# Patient Record
Sex: Male | Born: 1980 | Hispanic: Yes | Marital: Single | Smoking: Never smoker
Health system: Southern US, Community
[De-identification: ages and names within clinical notes are randomized; demographics above are authoritative.]

## PROBLEM LIST (undated history)

## (undated) DIAGNOSIS — S83519A Sprain of anterior cruciate ligament of unspecified knee, initial encounter: Secondary | ICD-10-CM

## (undated) DIAGNOSIS — S83249A Other tear of medial meniscus, current injury, unspecified knee, initial encounter: Secondary | ICD-10-CM

---

## 2003-01-24 ENCOUNTER — Encounter: Payer: Self-pay | Admitting: Emergency Medicine

## 2003-01-24 ENCOUNTER — Emergency Department (HOSPITAL_COMMUNITY): Admission: EM | Admit: 2003-01-24 | Discharge: 2003-01-24 | Payer: Self-pay | Admitting: Emergency Medicine

## 2010-05-22 ENCOUNTER — Ambulatory Visit (HOSPITAL_COMMUNITY): Admission: RE | Admit: 2010-05-22 | Discharge: 2010-05-22 | Payer: Self-pay | Admitting: Orthopedic Surgery

## 2010-06-12 ENCOUNTER — Ambulatory Visit
Admission: RE | Admit: 2010-06-12 | Discharge: 2010-06-12 | Payer: Self-pay | Source: Home / Self Care | Admitting: Orthopedic Surgery

## 2010-06-12 HISTORY — PX: KNEE ARTHROSCOPY W/ ACL RECONSTRUCTION: SHX1858

## 2010-11-06 LAB — POCT HEMOGLOBIN-HEMACUE: Hemoglobin: 16 g/dL (ref 13.0–17.0)

## 2014-01-23 DIAGNOSIS — S83249A Other tear of medial meniscus, current injury, unspecified knee, initial encounter: Secondary | ICD-10-CM

## 2014-01-23 DIAGNOSIS — S83519A Sprain of anterior cruciate ligament of unspecified knee, initial encounter: Secondary | ICD-10-CM

## 2014-01-23 HISTORY — DX: Other tear of medial meniscus, current injury, unspecified knee, initial encounter: S83.249A

## 2014-01-23 HISTORY — DX: Sprain of anterior cruciate ligament of unspecified knee, initial encounter: S83.519A

## 2014-02-03 ENCOUNTER — Encounter (HOSPITAL_BASED_OUTPATIENT_CLINIC_OR_DEPARTMENT_OTHER): Payer: Self-pay | Admitting: *Deleted

## 2014-02-08 ENCOUNTER — Encounter (HOSPITAL_BASED_OUTPATIENT_CLINIC_OR_DEPARTMENT_OTHER)
Admission: RE | Disposition: A | Discharge status: Home / Self Care | Payer: Self-pay | Source: Ambulatory Visit | Attending: Orthopedic Surgery

## 2014-02-08 ENCOUNTER — Ambulatory Visit (HOSPITAL_BASED_OUTPATIENT_CLINIC_OR_DEPARTMENT_OTHER): Payer: Self-pay | Admitting: Anesthesiology

## 2014-02-08 ENCOUNTER — Encounter (HOSPITAL_BASED_OUTPATIENT_CLINIC_OR_DEPARTMENT_OTHER): Payer: Self-pay | Admitting: Anesthesiology

## 2014-02-08 ENCOUNTER — Ambulatory Visit (HOSPITAL_BASED_OUTPATIENT_CLINIC_OR_DEPARTMENT_OTHER)
Admission: RE | Admit: 2014-02-08 | Discharge: 2014-02-08 | Disposition: A | Discharge status: Home / Self Care | Payer: Self-pay | Source: Ambulatory Visit | Attending: Orthopedic Surgery | Admitting: Orthopedic Surgery

## 2014-02-08 DIAGNOSIS — IMO0002 Reserved for concepts with insufficient information to code with codable children: Secondary | ICD-10-CM | POA: Insufficient documentation

## 2014-02-08 DIAGNOSIS — X500XXA Overexertion from strenuous movement or load, initial encounter: Secondary | ICD-10-CM | POA: Insufficient documentation

## 2014-02-08 DIAGNOSIS — S83509A Sprain of unspecified cruciate ligament of unspecified knee, initial encounter: Secondary | ICD-10-CM | POA: Insufficient documentation

## 2014-02-08 HISTORY — DX: Other tear of medial meniscus, current injury, unspecified knee, initial encounter: S83.249A

## 2014-02-08 HISTORY — DX: Sprain of anterior cruciate ligament of unspecified knee, initial encounter: S83.519A

## 2014-02-08 HISTORY — PX: KNEE ARTHROSCOPY WITH ANTERIOR CRUCIATE LIGAMENT (ACL) REPAIR: SHX5644

## 2014-02-08 SURGERY — KNEE ARTHROSCOPY WITH ANTERIOR CRUCIATE LIGAMENT (ACL) REPAIR
Anesthesia: General | Site: Knee | Laterality: Right

## 2014-02-08 MED ORDER — SODIUM CHLORIDE 0.9 % IR SOLN
Status: DC | PRN
  Administered 2014-02-08: 6000 mL

## 2014-02-08 MED ORDER — FENTANYL CITRATE 0.05 MG/ML IJ SOLN
50.0000 ug | INTRAMUSCULAR | Status: DC | PRN
  Administered 2014-02-08: 100 ug via INTRAVENOUS

## 2014-02-08 MED ORDER — DEXAMETHASONE SODIUM PHOSPHATE 4 MG/ML IJ SOLN
INTRAMUSCULAR | Status: DC | PRN
  Administered 2014-02-08: 10 mg via INTRAVENOUS

## 2014-02-08 MED ORDER — MIDAZOLAM HCL 5 MG/5ML IJ SOLN
INTRAMUSCULAR | Status: DC | PRN
  Administered 2014-02-08: 2 mg via INTRAVENOUS

## 2014-02-08 MED ORDER — KETOROLAC TROMETHAMINE 30 MG/ML IJ SOLN: 15.0000 mg | Freq: Once | INTRAMUSCULAR | Status: DC | PRN

## 2014-02-08 MED ORDER — FENTANYL CITRATE 0.05 MG/ML IJ SOLN
INTRAMUSCULAR | Status: AC
  Filled 2014-02-08: qty 2

## 2014-02-08 MED ORDER — MIDAZOLAM HCL 2 MG/2ML IJ SOLN
INTRAMUSCULAR | Status: AC
  Filled 2014-02-08: qty 2

## 2014-02-08 MED ORDER — OXYCODONE HCL 5 MG/5ML PO SOLN: 5.0000 mg | Freq: Once | ORAL | Status: DC | PRN

## 2014-02-08 MED ORDER — MIDAZOLAM HCL 2 MG/ML PO SYRP: 12.0000 mg | ORAL_SOLUTION | Freq: Once | ORAL | Status: DC | PRN

## 2014-02-08 MED ORDER — LIDOCAINE HCL (CARDIAC) 20 MG/ML IV SOLN
INTRAVENOUS | Status: DC | PRN
  Administered 2014-02-08: 100 mg via INTRAVENOUS

## 2014-02-08 MED ORDER — LACTATED RINGERS IV SOLN
INTRAVENOUS | Status: DC
  Administered 2014-02-08: 13:00:00 via INTRAVENOUS

## 2014-02-08 MED ORDER — FENTANYL CITRATE 0.05 MG/ML IJ SOLN
INTRAMUSCULAR | Status: AC
  Filled 2014-02-08: qty 6

## 2014-02-08 MED ORDER — ONDANSETRON HCL 4 MG/2ML IJ SOLN: 4.0000 mg | Freq: Once | INTRAMUSCULAR | Status: DC | PRN

## 2014-02-08 MED ORDER — OXYCODONE HCL 5 MG PO TABS: 5.0000 mg | ORAL_TABLET | Freq: Once | ORAL | Status: DC | PRN

## 2014-02-08 MED ORDER — PROPOFOL 10 MG/ML IV BOLUS
INTRAVENOUS | Status: DC | PRN
  Administered 2014-02-08: 200 mg via INTRAVENOUS

## 2014-02-08 MED ORDER — LACTATED RINGERS IV SOLN
INTRAVENOUS | Status: DC | PRN
  Administered 2014-02-08 (×2): via INTRAVENOUS

## 2014-02-08 MED ORDER — HYDROMORPHONE HCL PF 1 MG/ML IJ SOLN
0.2500 mg | INTRAMUSCULAR | Status: DC | PRN
  Administered 2014-02-08: 0.5 mg via INTRAVENOUS

## 2014-02-08 MED ORDER — OXYCODONE-ACETAMINOPHEN 5-325 MG PO TABS: 1.0000 | ORAL_TABLET | Freq: Four times a day (QID) | ORAL | Status: DC | PRN

## 2014-02-08 MED ORDER — MIDAZOLAM HCL 2 MG/2ML IJ SOLN
1.0000 mg | INTRAMUSCULAR | Status: DC | PRN
  Administered 2014-02-08: 2 mg via INTRAVENOUS

## 2014-02-08 MED ORDER — CHLORHEXIDINE GLUCONATE 4 % EX LIQD: 60.0000 mL | Freq: Once | CUTANEOUS | Status: DC

## 2014-02-08 MED ORDER — CEFAZOLIN SODIUM-DEXTROSE 2-3 GM-% IV SOLR
2.0000 g | INTRAVENOUS | Status: AC
  Administered 2014-02-08: 1 g via INTRAVENOUS
  Administered 2014-02-08: 2 g via INTRAVENOUS

## 2014-02-08 MED ORDER — BUPIVACAINE HCL (PF) 0.5 % IJ SOLN
INTRAMUSCULAR | Status: AC
  Filled 2014-02-08: qty 30

## 2014-02-08 MED ORDER — FENTANYL CITRATE 0.05 MG/ML IJ SOLN
INTRAMUSCULAR | Status: DC | PRN
  Administered 2014-02-08: 10 ug via INTRAVENOUS
  Administered 2014-02-08: 15 ug via INTRAVENOUS
  Administered 2014-02-08: 25 ug via INTRAVENOUS
  Administered 2014-02-08 (×2): 50 ug via INTRAVENOUS

## 2014-02-08 MED ORDER — CEFAZOLIN SODIUM-DEXTROSE 2-3 GM-% IV SOLR
INTRAVENOUS | Status: AC
  Filled 2014-02-08: qty 50

## 2014-02-08 MED ORDER — HYDROMORPHONE HCL PF 1 MG/ML IJ SOLN
INTRAMUSCULAR | Status: AC
  Filled 2014-02-08: qty 1

## 2014-02-08 SURGICAL SUPPLY — 81 items
ANCHOR PUSHLOCK PEEK 3.5X19.5 (Anchor) ×3 IMPLANT
BANDAGE ESMARK 6X9 LF (GAUZE/BANDAGES/DRESSINGS) ×1 IMPLANT
BENZOIN TINCTURE PRP APPL 2/3 (GAUZE/BANDAGES/DRESSINGS) ×3 IMPLANT
BLADE 4.2CUDA (BLADE) IMPLANT
BLADE AVERAGE 25MMX9MM (BLADE) ×1
BLADE AVERAGE 25X9 (BLADE) ×2 IMPLANT
BLADE CUDA 5.5 (BLADE) IMPLANT
BLADE CUTTER GATOR 3.5 (BLADE) IMPLANT
BLADE GREAT WHITE 4.2 (BLADE) ×2 IMPLANT
BLADE GREAT WHITE 4.2MM (BLADE) ×1
BLADE OSCIL/SAGITTAL W/10 ST (BLADE) IMPLANT
BLADE OSCIL/SAGITTAL W/10MM ST (BLADE)
BLADE SURG 15 STRL LF DISP TIS (BLADE) ×1 IMPLANT
BLADE SURG 15 STRL SS (BLADE) ×2
BNDG ESMARK 6X9 LF (GAUZE/BANDAGES/DRESSINGS) ×3
BUR OVAL 4.0 (BURR) ×3 IMPLANT
CANISTER SUCT 3000ML (MISCELLANEOUS) IMPLANT
CLOSURE WOUND 1/2 X4 (GAUZE/BANDAGES/DRESSINGS) ×1
COVER TABLE BACK 60X90 (DRAPES) ×3 IMPLANT
DRAPE ARTHROSCOPY W/POUCH 114 (DRAPES) ×3 IMPLANT
DRSG EMULSION OIL 3X3 NADH (GAUZE/BANDAGES/DRESSINGS) ×3 IMPLANT
DURAPREP 26ML APPLICATOR (WOUND CARE) ×3 IMPLANT
ELECT MENISCUS 165MM 90D (ELECTRODE) IMPLANT
ELECT REM PT RETURN 9FT ADLT (ELECTROSURGICAL) ×3
ELECTRODE REM PT RTRN 9FT ADLT (ELECTROSURGICAL) ×1 IMPLANT
GAUZE SPONGE 4X4 12PLY STRL (GAUZE/BANDAGES/DRESSINGS) ×3 IMPLANT
GLOVE BIO SURGEON STRL SZ 6.5 (GLOVE) ×4 IMPLANT
GLOVE BIO SURGEONS STRL SZ 6.5 (GLOVE) ×2
GLOVE BIOGEL M STRL SZ7.5 (GLOVE) ×3 IMPLANT
GLOVE BIOGEL PI IND STRL 7.0 (GLOVE) ×2 IMPLANT
GLOVE BIOGEL PI IND STRL 8 (GLOVE) ×3 IMPLANT
GLOVE BIOGEL PI INDICATOR 7.0 (GLOVE) ×4
GLOVE BIOGEL PI INDICATOR 8 (GLOVE) ×6
GLOVE ECLIPSE 7.5 STRL STRAW (GLOVE) ×6 IMPLANT
GOWN STRL REUS W/ TWL LRG LVL3 (GOWN DISPOSABLE) ×3 IMPLANT
GOWN STRL REUS W/ TWL XL LVL3 (GOWN DISPOSABLE) ×2 IMPLANT
GOWN STRL REUS W/TWL LRG LVL3 (GOWN DISPOSABLE) ×6
GOWN STRL REUS W/TWL XL LVL3 (GOWN DISPOSABLE) ×7 IMPLANT
GRAFT ACHILLES TENDON (Bone Implant) ×3 IMPLANT
HOLDER KNEE FOAM BLUE (MISCELLANEOUS) ×3 IMPLANT
IMMOBILIZER KNEE 22 UNIV (SOFTGOODS) IMPLANT
IMMOBILIZER KNEE 24 THIGH 36 (MISCELLANEOUS) IMPLANT
IMMOBILIZER KNEE 24 UNIV (MISCELLANEOUS)
IV NS IRRIG 3000ML ARTHROMATIC (IV SOLUTION) ×6 IMPLANT
KIT TRANSTIBIAL (DISPOSABLE) ×3 IMPLANT
KNEE WRAP E Z 3 GEL PACK (MISCELLANEOUS) ×3 IMPLANT
KNIFE GRAFT ACL 10MM 5952 (MISCELLANEOUS) IMPLANT
KNIFE GRAFT ACL 11MM (MISCELLANEOUS) IMPLANT
MANIFOLD NEPTUNE II (INSTRUMENTS) ×3 IMPLANT
NDL SAFETY ECLIPSE 18X1.5 (NEEDLE) IMPLANT
NEEDLE HYPO 18GX1.5 SHARP (NEEDLE)
PACK ARTHROSCOPY DSU (CUSTOM PROCEDURE TRAY) ×3 IMPLANT
PACK BASIN DAY SURGERY FS (CUSTOM PROCEDURE TRAY) ×3 IMPLANT
PAD CAST 4YDX4 CTTN HI CHSV (CAST SUPPLIES) ×1 IMPLANT
PADDING CAST COTTON 4X4 STRL (CAST SUPPLIES) ×2
PASSER SUT SWANSON 36MM LOOP (INSTRUMENTS) IMPLANT
PATELLA LIGAMENT BISECTED FR (Tissue) ×3 IMPLANT
PENCIL BUTTON HOLSTER BLD 10FT (ELECTRODE) IMPLANT
SCREW BIO DELTA 10MM (Screw) ×3 IMPLANT
SCREW SHEATHED INTERF 9X25MM (Screw) ×3 IMPLANT
SET ARTHROSCOPY TUBING (MISCELLANEOUS) ×2
SET ARTHROSCOPY TUBING LN (MISCELLANEOUS) ×1 IMPLANT
SHEET MEDIUM DRAPE 40X70 STRL (DRAPES) ×3 IMPLANT
SPONGE LAP 4X18 X RAY DECT (DISPOSABLE) ×3 IMPLANT
STRIP CLOSURE SKIN 1/2X4 (GAUZE/BANDAGES/DRESSINGS) ×2 IMPLANT
SUCTION FRAZIER TIP 10 FR DISP (SUCTIONS) ×3 IMPLANT
SUT ETHILON 4 0 PS 2 18 (SUTURE) IMPLANT
SUT MNCRL AB 3-0 PS2 18 (SUTURE) ×3 IMPLANT
SUT PDS AB 1 CT  36 (SUTURE) ×6
SUT PDS AB 1 CT 36 (SUTURE) ×3 IMPLANT
SUT STEEL 5 (SUTURE) ×3 IMPLANT
SUT TICRON 1 T 12 (SUTURE) IMPLANT
SUT VIC AB 0 CT1 27 (SUTURE)
SUT VIC AB 0 CT1 27XBRD ANBCTR (SUTURE) IMPLANT
SUT VIC AB 0 SH 27 (SUTURE) ×3 IMPLANT
SUT VIC AB 2-0 SH 27 (SUTURE)
SUT VIC AB 2-0 SH 27XBRD (SUTURE) IMPLANT
SYR 5ML LL (SYRINGE) ×3 IMPLANT
TOWEL OR 17X24 6PK STRL BLUE (TOWEL DISPOSABLE) ×9 IMPLANT
TOWEL OR NON WOVEN STRL DISP B (DISPOSABLE) ×3 IMPLANT
WATER STERILE IRR 1000ML POUR (IV SOLUTION) ×3 IMPLANT

## 2014-02-08 NOTE — Anesthesia Preprocedure Evaluation (Signed)
Anesthesia Evaluation  Patient identified by MRN, date of birth, ID band Patient awake    Reviewed: Allergy & Precautions, H&P , NPO status , Patient's Chart, lab work & pertinent test results  Airway Mallampati: II TM Distance: >3 FB Neck ROM: Full    Dental  (+) Teeth Intact, Dental Advisory Given   Pulmonary  breath sounds clear to auscultation        Cardiovascular Rhythm:Regular Rate:Normal     Neuro/Psych    GI/Hepatic   Endo/Other    Renal/GU      Musculoskeletal   Abdominal   Peds  Hematology   Anesthesia Other Findings   Reproductive/Obstetrics                           Anesthesia Physical Anesthesia Plan  ASA: I  Anesthesia Plan: General   Post-op Pain Management:    Induction: Intravenous  Airway Management Planned: LMA  Additional Equipment:   Intra-op Plan:   Post-operative Plan:   Informed Consent: I have reviewed the patients History and Physical, chart, labs and discussed the procedure including the risks, benefits and alternatives for the proposed anesthesia with the patient or authorized representative who has indicated his/her understanding and acceptance.   Dental advisory given  Plan Discussed with: CRNA and Anesthesiologist  Anesthesia Plan Comments:         Anesthesia Quick Evaluation

## 2014-02-08 NOTE — Discharge Instructions (Signed)
POST-OP ACL RECONSTRUCTION  1. Come to office tomorrow at 2 PM for your  first postop visit  2. Leave the steri-strips in place over your incisions when performing dressing changes and showering. Remove your dressings tomorrow to perform first dressings change. Then change dressing daily or as needed. You may shower and get incision wet on day 5 from surgery. Do not submerge incision in tub or under water.  3. Wear your knee immobilizer or hinged knee brace to sleep and at all times while up. You may put full weight on operative leg with brace on. Crutches are for comfort. Remove brace to perform attached exercises.  4. It is strongly recommended to ice and elevate your leg on a regular basis and at a minimum of 4 times a day for 30 minute sessions. You should ice after your exercise sessions and more if you are having a lot of pain or increase in swelling.  5. The thigh high Ted hose (white stockings) help to decrease swelling and prevent blood clots. It is recommended that you wear them on both legs for the first 3 days. Then you should wear on the operative extremity when upright but can remove to sleep.  6. If you had a block pre-operatively to provide post-op pain relief you may want to go ahead and begin utilizing your pain meds as your arm begins to wake up. Blocks can sometimes last up to 16-18 hours. If you are still pain-free prior to going to bed you may want to strongly consider taking a pain medication to avoid being awakened in the night with the onset of pain. A muscle relaxant is also provided for you should you experience muscle spasms. It is recommended that if you are experiencing pain that your pain medication alone is not controlling, add the muscle relaxant along with the pain medication which can give additional pain relief. The first one to two days is generally the most severe of your pain and then should gradually decrease. As your pain lessens it is recommended that you  decrease your use of the pain medications to an "as needed basis" only and to always comply with the recommended dosages of the pain medications.  7. Pain medications can produce constipation along with their use. If you experience this, the use of an over the counter stool softener or laxative daily is recommended.   8. For additional questions or concerns, please do not hesitate to call the office. If after hours there is an answering service to forward your concerns to the physician on call.   POST-OP EXERCISES  Repeat each exercise 10 times per set. Do 3 sets per session. Do 3 sessions per day.  Ankle/Foot Range of Motion  With leg relaxed, gently flex and extend ankle. Move through full range of motion.     Knee Extension Mobilization: Towel Prop  With a small towel rolled under your ankle, relax your leg to feel a comfortable stretch along the backside of your knee/leg. Hold for 3-5 minutes.     Hip/Knee Strengthening: Quadriceps Set  Tighten your quadriceps (top of thigh) by pulling your patella (kneecap) toward your hip. Keep your buttocks relaxed. Hold for 5-10 seconds.     Hip/Knee Strengthening: Straight Leg Raise  With your brace on, tighten your quadriceps and lift your leg 12-18 inches from surface.     Hip/Knee Stretching: Calf - Towel  Sit with knee straight and towel looped around your foot. Gently pull on towel  until stretch is felt in calf. Hold for 20-30 seconds.     Hip/Knee: Knee Flexion  With a towel around your heel, gently pull knee up with towel until stretch is felt. Hold 20-30 seconds.          Post Anesthesia Home Care Instructions  Activity: Get plenty of rest for the remainder of the day. A responsible adult should stay with you for 24 hours following the procedure.  For the next 24 hours, DO NOT: -Drive a car -Advertising copywriterperate machinery -Drink alcoholic beverages -Take any medication unless instructed by your physician -Make  any legal decisions or sign important papers.  Meals: Start with liquid foods such as gelatin or soup. Progress to regular foods as tolerated. Avoid greasy, spicy, heavy foods. If nausea and/or vomiting occur, drink only clear liquids until the nausea and/or vomiting subsides. Call your physician if vomiting continues.  Special Instructions/Symptoms: Your throat may feel dry or sore from the anesthesia or the breathing tube placed in your throat during surgery. If this causes discomfort, gargle with warm salt water. The discomfort should disappear within 24 hours.  Regional Anesthesia Blocks  1. Numbness or the inability to move the "blocked" extremity may last from 3-48 hours after placement. The length of time depends on the medication injected and your individual response to the medication. If the numbness is not going away after 48 hours, call your surgeon.  2. The extremity that is blocked will need to be protected until the numbness is gone and the  Strength has returned. Because you cannot feel it, you will need to take extra care to avoid injury. Because it may be weak, you may have difficulty moving it or using it. You may not know what position it is in without looking at it while the block is in effect.  3. For blocks in the legs and feet, returning to weight bearing and walking needs to be done carefully. You will need to wait until the numbness is entirely gone and the strength has returned. You should be able to move your leg and foot normally before you try and bear weight or walk. You will need someone to be with you when you first try to ensure you do not fall and possibly risk injury.  4. Bruising and tenderness at the needle site are common side effects and will resolve in a few days.  5. Persistent numbness or new problems with movement should be communicated to the surgeon or the [facility] [phone])/ [facility] [phone]).

## 2014-02-08 NOTE — Transfer of Care (Signed)
Immediate Anesthesia Transfer of Care Note  Patient: Joe Booth  Procedure(s) Performed: Procedure(s): KNEE ARTHROSCOPY WITH ANTERIOR CRUCIATE LIGAMENT (ACL) REPAIR and partial medial menisectomy (Right)  Patient Location: PACU  Anesthesia Type:GA combined with regional for post-op pain  Level of Consciousness: awake, alert , oriented and patient cooperative  Airway & Oxygen Therapy: Patient Spontanous Breathing and Patient connected to face mask oxygen  Post-op Assessment: Report given to PACU RN and Post -op Vital signs reviewed and stable  Post vital signs: Reviewed and stable  Complications: No apparent anesthesia complications

## 2014-02-08 NOTE — Anesthesia Postprocedure Evaluation (Signed)
  Anesthesia Post-op Note  Patient: Joe Booth  Procedure(s) Performed: Procedure(s): KNEE ARTHROSCOPY WITH ANTERIOR CRUCIATE LIGAMENT (ACL) REPAIR and partial medial menisectomy (Right)  Patient Location: PACU  Anesthesia Type:GA combined with regional for post-op pain  Level of Consciousness: awake, alert  and oriented  Airway and Oxygen Therapy: Patient Spontanous Breathing  Post-op Pain: mild  Post-op Assessment: Post-op Vital signs reviewed  Post-op Vital Signs: Reviewed  Last Vitals:  Filed Vitals:   02/08/14 1745  BP:   Pulse: 105  Temp:   Resp: 15    Complications: No apparent anesthesia complications

## 2014-02-08 NOTE — Brief Op Note (Signed)
02/08/2014  5:20 PM  PATIENT:  Joe Booth  33 y.o. male  PRE-OPERATIVE DIAGNOSIS:  acl and medial meniscus tear  POST-OPERATIVE DIAGNOSIS:  acl and medial meniscus tear  PROCEDURE:  Procedure(s): KNEE ARTHROSCOPY WITH ANTERIOR CRUCIATE LIGAMENT (ACL) REPAIR and partial medial menisectomy (Right)  SURGEON:  Surgeon(s) and Role:    * Harvie JuniorJohn L Labella Zahradnik, MD - Primary  PHYSICIAN ASSISTANT:   ASSISTANTS: bethune   ANESTHESIA:   general  EBL:  Total I/O In: 1400 [I.V.:1400] Out: -   BLOOD ADMINISTERED:none  DRAINS: none   LOCAL MEDICATIONS USED:  NONE  SPECIMEN:  No Specimen  DISPOSITION OF SPECIMEN:  N/A  COUNTS:  YES  TOURNIQUET:   Total Tourniquet Time Documented: Thigh (Right) - 72 minutes Thigh (Right) - 44 minutes Total: Thigh (Right) - 116 minutes   DICTATION: .Other Dictation: Dictation Number [id number]  PLAN OF CARE: Discharge to home after PACU  PATIENT DISPOSITION:  PACU - hemodynamically stable.   Delay start of Pharmacological VTE agent (>24hrs) due to surgical blood loss or risk of bleeding: no

## 2014-02-08 NOTE — H&P (Signed)
PREOPERATIVE H&P  Chief Complaint: r knee instabilityand pain  HPI: Joe Booth is a 33 y.o. male who presents for evaluation of r knee instability and pain. It has been present for 6 months and has been worsening. He has failed conservative measures. Pain is rated as moderate.  Past Medical History  Diagnosis Date  . ACL tear 01/2014    right  . Medial meniscus tear 01/2014    right knee   Past Surgical History  Procedure Laterality Date  . Knee arthroscopy w/ acl reconstruction Right 06/12/2010   History   Social History  . Marital Status: Single    Spouse Name: N/A    Number of Children: N/A  . Years of Education: N/A   Social History Main Topics  . Smoking status: Never Smoker   . Smokeless tobacco: Never Used  . Alcohol Use: Yes     Comment: occasionally  . Drug Use: No  . Sexual Activity: None   Other Topics Concern  . None   Social History Narrative  . None   History reviewed. No pertinent family history. No Known Allergies Prior to Admission medications   Medication Sig Start Date End Date Taking? Authorizing Provider  Multiple Vitamin (MULTIVITAMIN) tablet Take 1 tablet by mouth daily.   Yes Historical Provider, MD     Positive ROS: none  All other systems have been reviewed and were otherwise negative with the exception of those mentioned in the HPI and as above.  Physical Exam: Filed Vitals:   02/08/14 1244  BP: 123/82  Pulse: 61  Temp: 98.2 F (36.8 C)  Resp: 16    General: Alert, no acute distress Cardiovascular: No pedal edema Respiratory: No cyanosis, no use of accessory musculature GI: No organomegaly, abdomen is soft and non-tender Skin: No lesions in the area of chief complaint Neurologic: Sensation intact distally Psychiatric: Patient is competent for consent with normal mood and affect Lymphatic: No axillary or cervical lymphadenopathy  MUSCULOSKELETAL: r knee: painful rom.  +mcmurray// +lochman and pivot  shifty  Assessment/Plan: acl and medial meniscus tear Plan for Procedure(s): KNEE ARTHROSCOPY WITH ANTERIOR CRUCIATE LIGAMENT (ACL) REPAIR and meniscal resection  The risks benefits and alternatives were discussed with the patient including but not limited to the risks of nonoperative treatment, versus surgical intervention including infection, bleeding, nerve injury, malunion, nonunion, hardware prominence, hardware failure, need for hardware removal, blood clots, cardiopulmonary complications, morbidity, mortality, among others, and they were willing to proceed.  Predicted outcome is good, although there will be at least a six to nine month expected recovery.  Harvie JuniorGRAVES,Nautica Hotz L, MD 02/08/2014 12:55 PM

## 2014-02-08 NOTE — Progress Notes (Signed)
Assisted Dr. Joslin with right, ultrasound guided, femoral nerve  block. Side rails up, monitors on throughout procedure. See vital signs in flow sheet. Tolerated Procedure well. 

## 2014-02-08 NOTE — Anesthesia Procedure Notes (Addendum)
Anesthesia Regional Block:  Adductor canal block  Pre-Anesthetic Checklist: ,, timeout performed, Correct Patient, Correct Site, Correct Laterality, Correct Procedure, Correct Position, site marked, Risks and benefits discussed,  Surgical consent,  Pre-op evaluation,  At surgeon's request and post-op pain management  Laterality: Right  Prep: chloraprep       Needles:  Injection technique: Single-shot  Needle Type: Echogenic Stimulator Needle     Needle Length: 9cm 9 cm Needle Gauge: 22 and 22 G    Additional Needles:  Procedures: ultrasound guided (picture in chart) Adductor canal block Narrative:  Start time: 02/08/2014 1:20 PM End time: 02/08/2014 1:25 PM Injection made incrementally with aspirations every 5 mL.  Performed by: Personally   Additional Notes: 30 cc 0.5% marcaine 1:200 Epi  Kipp Broodavid Joslin   Procedure Name: LMA Insertion Date/Time: 02/08/2014 1:53 PM Performed by: Genevieve NorlanderLINKA, MARGARET L Pre-anesthesia Checklist: Patient identified, Emergency Drugs available, Suction available, Patient being monitored and Timeout performed Patient Re-evaluated:Patient Re-evaluated prior to inductionOxygen Delivery Method: Circle System Utilized Preoxygenation: Pre-oxygenation with 100% oxygen Intubation Type: IV induction Ventilation: Mask ventilation without difficulty LMA: LMA inserted LMA Size: 5.0 Number of attempts: 1 Airway Equipment and Method: bite block Placement Confirmation: positive ETCO2 and breath sounds checked- equal and bilateral Tube secured with: Tape Dental Injury: Teeth and Oropharynx as per pre-operative assessment

## 2014-02-09 ENCOUNTER — Encounter (HOSPITAL_BASED_OUTPATIENT_CLINIC_OR_DEPARTMENT_OTHER): Payer: Self-pay | Admitting: Orthopedic Surgery

## 2014-02-09 NOTE — Op Note (Signed)
NAMEmmit Alexanders:  Santiesteban, Breckan                ACCOUNT NO.:  [id number]  MEDICAL RECORD NO.:  [id number]  LOCATION:                                 FACILITY:  PHYSICIAN:  Harvie JuniorJohn L. Graves, M.D.   DATE OF BIRTH:  12/21/1980  DATE OF PROCEDURE:  02/08/2014 DATE OF DISCHARGE:  02/08/2014                              OPERATIVE REPORT   PREOPERATIVE DIAGNOSES: 1. Anterior cruciate ligament re-tear. 2. Medial meniscal bucket-handle tear.  POSTOPERATIVE DIAGNOSES: 1. Anterior cruciate ligament re-tear. 2. Medial meniscal bucket-handle tear.  PROCEDURE: 1. Anterior cruciate ligament reconstruction with Achilles tendon     allograft. 2. Partial medial meniscectomy of a bucket-handle medial meniscal     tear.  SURGEON:  Harvie JuniorJohn L. Graves, M.D.  ASSISTANT:  Marshia LyJames Bethune, PA-C  ANESTHESIA:  General.  BRIEF HISTORY:  Mr. Lonna CobbRomero is a 33 year old male with history of having had an ACL reconstruction, did great for many years, but unfortunately had a retwisting injury.  At that time, suffered a medial meniscal tear and a re-tear of his ACL.  MRI confirmed this and after discussion of treatment options, we felt that fixation was the appropriate course of action.  He is brought to the operating room for this procedure.  DESCRIPTION OF PROCEDURE:  The patient was brought to the operating room.  After adequate anesthesia was obtained with general anesthetic, the patient was placed supine on the operating table.  The right leg was then examined under anesthesia and noted to be Lachman positive and Pivot positive.  At this point, routine arthroscopic examination was performed after prep and drape, and showed that the ACL was torn.  There was a large bucket-handle medial meniscal tear with complex fractured features of the meniscus and this was resected at the anterior and posterior halves and then debrided.  Attention turned to the notch, where the old ACL was resected and the lateral side was examined,  noted to be normal, the patellofemoral joint  normal.  After resecting of the old guide, I did feel like we might try to lower the tunnel down some and so at this point, we did a notchplasty.  We took out the old screw and went ahead and made our incision, and used a 60-degree guide and we were able to pass by the other metal screw easily and at this point, we felt that we would try to use this lower screw, put a guide wire in, and then reamed up, got a nice hole and we were happy with everything.  We pushed past the patellar tendon graft and felt that we would use a larger screw just to try to get fixation if it happened to fill the old hole.  We thought that was good.  Unfortunately in trying to pass the screw, it actually moved the graft into the old hole and the screw diverged into the new hole.  At that point, I had concerns that I could not see the screw and I was not sure what it had happened and so I had to take the screw out and the graft down.  At that point, it looked like the screw had sheared, probably 60-70% of  the interface of the tendon to bone.  At that point, we had to review her options and I felt that the best option at that point, would be to put the Beath needle back in the old position, reamed to 12 and using Achilles tendon allograft, so that we did.  The Achilles tendon allograft was fashioned on the back table by graphologist.  We then advanced a 12 mm reamer through the tibia and through the femur and then you could use the 12 mm bone plug with a 12 mm collagen basically from the whipstitched Achilles tendon graft and put that bone plug up in that hole and now with a 9 x 20 screw was locked in dramatically well.  With tension under the distal graft, we put a 10 x 35 screw in that 12 mm hole, locking the collagen portion of the Achilles in, and then backed this up with a push lock.  Three 5 mm push lock which gave excellent fixation of the Achilles graft at  this point.  We had a rock-solid ACL at this point.  Lachman negative, pivot negative, took a final check of the medial and lateral compartments. These looked good.  No evidence of chondromalacia or problems. At this point, the wound was copiously irrigated, suctioned dry, closed in layers.  We did use the tourniquet in the first portion of the case.  It was up for 76 minutes and then let the tourniquet down for 10 minutes and then reinflated it.  The second tourniquet time was about 44 minutes.  The patient had a sterile compressive dressing applied after his wounds were closed in layers and he was taken to recovery room, he was noted to be in satisfactory condition.  Estimated blood loss for the procedure was minimal.     Harvie JuniorJohn L. Graves, M.D.     Ranae PlumberJLG/MEDQ  D:  02/08/2014  T:  02/08/2014  Job:  [id number]

## 2016-04-08 ENCOUNTER — Encounter: Payer: Self-pay | Admitting: Family Medicine

## 2016-04-08 ENCOUNTER — Ambulatory Visit (INDEPENDENT_AMBULATORY_CARE_PROVIDER_SITE_OTHER): Payer: Self-pay | Admitting: Family Medicine

## 2016-04-08 VITALS — BP 118/76 | HR 74 | Temp 98.3°F | Resp 18 | Ht 67.0 in | Wt 182.0 lb

## 2016-04-08 DIAGNOSIS — N509 Disorder of male genital organs, unspecified: Secondary | ICD-10-CM

## 2016-04-08 DIAGNOSIS — R1031 Right lower quadrant pain: Secondary | ICD-10-CM

## 2016-04-08 DIAGNOSIS — N50819 Testicular pain, unspecified: Secondary | ICD-10-CM | POA: Insufficient documentation

## 2016-04-08 DIAGNOSIS — R1030 Lower abdominal pain, unspecified: Secondary | ICD-10-CM | POA: Insufficient documentation

## 2016-04-08 NOTE — Patient Instructions (Addendum)
   Imaging will contact you to schedule your ultrasound. Ok to take Ibuprofen or use cool compressesfor pain.  Schedule a follow-up visit to discuss fatigue.  Godfrey PickKimberly S. Tiburcio PeaHarris, MSN, FNP-C [facility]   IF you received an x-ray today, you will receive an invoice from [facility]. Please contact [facility] at [phone] with questions or concerns regarding your invoice.   IF you received labwork today, you will receive an invoice from [facility]. Please contact [facility] at [phone] with questions or concerns regarding your invoice.   Our billing staff will not be able to assist you with questions regarding bills from these companies.  You will be contacted with the lab results as soon as they are available. The fastest way to get your results is to activate your My Chart account. Instructions are located on the last page of this paperwork. If you have not heard from us regarding the results in 2 weeks, please contact this office.

## 2016-04-08 NOTE — Progress Notes (Signed)
Patient ID: Joe Booth, male    DOB: 17-Jul-1981, 35 y.o.   MRN: [id number]  PCP: No PCP Per Patient  Chief Complaint  Patient presents with  . Leg Pain    right inner thigh    Subjective:   HPI Presents for evaluation of right upper groin pain x two weeks.  Thought it was originally related to not warming up prior to running and play soccer. Felt a lump or an elongated mass in right groin area. With touch the area hurts worst. Described pain as aching and 3-4/10.  No over the counter medication. Pain increases after sexual intercourse. No testicle swelling or burning with urination.  . Social History   Social History  . Marital status: Single    Spouse name: N/A  . Number of children: N/A  . Years of education: N/A   Occupational History  . Not on file.   Social History Main Topics  . Smoking status: Never Smoker  . Smokeless tobacco: Never Used  . Alcohol use Yes     Comment: occasionally  . Drug use: No  . Sexual activity: Not on file   Other Topics Concern  . Not on file   Social History Narrative  . No narrative on file    . Family History  Problem Relation Age of Onset  . Heart disease Mother     Review of Systems  Constitutional: Positive for fatigue.  Gastrointestinal: Negative.   Genitourinary: Positive for scrotal swelling. Negative for decreased urine volume, difficulty urinating, discharge, dysuria, enuresis, flank pain, hematuria, penile pain, penile swelling and urgency.    There are no active problems to display for this patient.    Prior to Admission medications   Medication Sig Start Date End Date Taking? Authorizing Provider  Multiple Vitamin (MULTIVITAMIN) tablet Take 1 tablet by mouth daily.   Yes Historical Provider, MD  NON FORMULARY    Yes Historical Provider, MD  oxyCODONE-acetaminophen (PERCOCET/ROXICET) 5-325 MG per tablet Take 1-2 tablets by mouth every 6 (six) hours as needed for severe pain. Patient not taking:  Reported on 04/08/2016 02/08/14   Marshia LyJames Bethune, PA-C    No Known Allergies     Objective:  Physical Exam  Constitutional: He is oriented to person, place, and time. He appears well-developed and well-nourished.  HENT:  Head: Normocephalic and atraumatic.  Right Ear: External ear normal.  Left Ear: External ear normal.  Neck: Normal range of motion.  Cardiovascular: Normal rate, regular rhythm and normal heart sounds.   Pulmonary/Chest: Effort normal and breath sounds normal.  Abdominal: Soft. He exhibits no distension and no mass. There is no tenderness. There is no rebound and no guarding.  Genitourinary: Penis normal. No penile tenderness.  Genitourinary Comments: Scrotum appears normal. Absent of mass or lesions. Bilateral testicle palpable. No mass or lesions present.  Denies tenderness with palpation.  Musculoskeletal: Normal range of motion.  Neurological: He is alert and oriented to person, place, and time.  Skin: Skin is warm and dry.   Vitals:   04/08/16 1352  BP: 118/76  Pulse: 74  Resp: 18  Temp: 98.3 F (36.8 C)    Assessment & Plan:  1. Persistent pain in testicle 2. Groin pain, right Physical exam unremarkable. Patient is very concern as this pain has persisted intermittently for two weeks. -US Scrotum   Will follow-up upon receipt of results.  Schedule follow-up to evaluate fatigue.  Godfrey PickKimberly S. Tiburcio PeaHarris, MSN, FNP-C [facility]

## 2016-04-14 ENCOUNTER — Telehealth: Payer: Self-pay

## 2016-04-14 DIAGNOSIS — N509 Disorder of male genital organs, unspecified: Secondary | ICD-10-CM

## 2016-04-14 DIAGNOSIS — N50819 Testicular pain, unspecified: Secondary | ICD-10-CM

## 2016-04-14 NOTE — Telephone Encounter (Signed)
In order to schedule the ultrasound of the scrotum, an additional order is required. Please add an order for US Art/Ven Flow Abd Pelv Doppler.  Thank you.  Sending as high priority, since the order was placed as a STAT.

## 2016-04-14 NOTE — Telephone Encounter (Signed)
Order placed

## 2016-04-18 ENCOUNTER — Ambulatory Visit
Admission: RE | Admit: 2016-04-18 | Discharge: 2016-04-18 | Disposition: A | Discharge status: Home / Self Care | Payer: No Typology Code available for payment source | Source: Ambulatory Visit | Attending: Family Medicine | Admitting: Family Medicine

## 2016-04-18 DIAGNOSIS — N509 Disorder of male genital organs, unspecified: Secondary | ICD-10-CM

## 2016-04-18 DIAGNOSIS — R1031 Right lower quadrant pain: Secondary | ICD-10-CM

## 2016-04-18 DIAGNOSIS — N50819 Testicular pain, unspecified: Secondary | ICD-10-CM

## 2016-04-21 ENCOUNTER — Telehealth: Payer: Self-pay

## 2016-04-21 DIAGNOSIS — N50819 Testicular pain, unspecified: Secondary | ICD-10-CM

## 2016-04-21 NOTE — Telephone Encounter (Signed)
Pt called spoke to him about his ultrasound

## 2016-05-07 ENCOUNTER — Ambulatory Visit (INDEPENDENT_AMBULATORY_CARE_PROVIDER_SITE_OTHER): Payer: Self-pay

## 2016-05-07 ENCOUNTER — Ambulatory Visit (INDEPENDENT_AMBULATORY_CARE_PROVIDER_SITE_OTHER): Payer: Self-pay | Admitting: Urgent Care

## 2016-05-07 VITALS — BP 118/82 | HR 78 | Temp 97.5°F | Resp 17 | Ht 67.0 in | Wt 185.0 lb

## 2016-05-07 DIAGNOSIS — S4991XA Unspecified injury of right shoulder and upper arm, initial encounter: Secondary | ICD-10-CM

## 2016-05-07 DIAGNOSIS — M25511 Pain in right shoulder: Secondary | ICD-10-CM

## 2016-05-07 MED ORDER — NAPROXEN SODIUM 550 MG PO TABS: 550.0000 mg | ORAL_TABLET | Freq: Two times a day (BID) | ORAL | 1 refills | Status: DC

## 2016-05-07 MED ORDER — CYCLOBENZAPRINE HCL 5 MG PO TABS: 5.0000 mg | ORAL_TABLET | Freq: Three times a day (TID) | ORAL | 1 refills | Status: DC | PRN

## 2016-05-07 NOTE — Progress Notes (Signed)
    MRN: [id number] DOB: 09-Aug-1981  Subjective:   Joe Booth is a 35 y.o. male presenting for chief complaint of Shoulder Pain (Right. Soccer injury. )  Reports 3 day history of right should injury. Patient was playing soccer and collided with another player. He has since had worsening right shoulder pain. Pain is constant, achy with intermittent sharp pains, has associated swelling. Has difficulty raising his arm. Has tried Advil and Bengay with minimal relief. Denies hearing popping or tearing noises, neck pain, numbness or tingling, bruising, redness.  Joe Booth has a current medication list which includes the following prescription(s): multivitamin, NON FORMULARY, and oxycodone-acetaminophen. Also has No Known Allergies.  Joe Booth  has a past medical history of ACL tear (01/2014) and Medial meniscus tear (01/2014). Also  has a past surgical history that includes Knee arthroscopy w/ ACL reconstruction (Right, 06/12/2010) and Knee arthroscopy with anterior cruciate ligament (acl) repair (Right, 02/08/2014).  Objective:   Vitals: BP 118/82 (BP Location: Left Arm, Patient Position: Sitting, Cuff Size: Normal)   Pulse 78   Temp 97.5 F (36.4 C) (Oral)   Resp 17   Ht 5\' 7"  (1.702 m)   Wt 185 lb (83.9 kg)   SpO2 98%   BMI 28.98 kg/m   Physical Exam  Constitutional: He is oriented to person, place, and time. He appears well-developed and well-nourished.  Cardiovascular: Normal rate.   Pulmonary/Chest: Effort normal.  Musculoskeletal:       Right shoulder: He exhibits decreased range of motion (in all planes, exquisite tenderness throughout active ROM), tenderness (highly positive Hawkin and Neer tests) and swelling (trace). He exhibits no bony tenderness, no effusion, no crepitus, no deformity, no laceration, no spasm and normal strength.  Neurological: He is alert and oriented to person, place, and time.    Dg Shoulder Right  Result Date: 05/07/2016 CLINICAL DATA:  Right shoulder pain.   Injury. EXAM: RIGHT SHOULDER - 2+ VIEW COMPARISON:  None. FINDINGS: There is no evidence of fracture or dislocation. There is no evidence of arthropathy or other focal bone abnormality. Soft tissues are unremarkable. IMPRESSION: Negative. Electronically Signed   By: Elberta Fortisaniel  Boyle M.D.   On: 05/07/2016 13:04   Assessment and Plan :   1. Right shoulder pain 2. Right shoulder injury, initial encounter - Symptoms concerning for rotator cuff injury, will refer to ortho urgently for further imaging. In the meantime, patient is to start Anaprox, Flexeril. Advised that he perform light rotator cuff exercises within pain tolerance to avoid adhesive capsulitis. I offered patient work restrictions but he refused these.   Wallis BambergMario Casin Federici, PA-C [facility] [phone] 05/07/2016 12:44 PM

## 2016-05-07 NOTE — Patient Instructions (Addendum)
Please limit your right shoulder movement to no overhead activity, no lifting greater than 5lbs with your right shoulder. I do want you to continue using your right arm and shoulder within your pain tolerance.    Shoulder Pain The shoulder is the joint that connects your arms to your body. The bones that form the shoulder joint include the upper arm bone (humerus), the shoulder blade (scapula), and the collarbone (clavicle). The top of the humerus is shaped like a ball and fits into a rather flat socket on the scapula (glenoid cavity). A combination of muscles and strong, fibrous tissues that connect muscles to bones (tendons) support your shoulder joint and hold the ball in the socket. Small, fluid-filled sacs (bursae) are located in different areas of the joint. They act as cushions between the bones and the overlying soft tissues and help reduce friction between the gliding tendons and the bone as you move your arm. Your shoulder joint allows a wide range of motion in your arm. This range of motion allows you to do things like scratch your back or throw a ball. However, this range of motion also makes your shoulder more prone to pain from overuse and injury. Causes of shoulder pain can originate from both injury and overuse and usually can be grouped in the following four categories:  Redness, swelling, and pain (inflammation) of the tendon (tendinitis) or the bursae (bursitis).  Instability, such as a dislocation of the joint.  Inflammation of the joint (arthritis).  Broken bone (fracture). HOME CARE INSTRUCTIONS   Apply ice to the sore area.  Put ice in a plastic bag.  Place a towel between your skin and the bag.  Leave the ice on for 15-20 minutes, 3-4 times per day for the first 2 days, or as directed by your health care provider.  Stop using cold packs if they do not help with the pain.  If you have a shoulder sling or immobilizer, wear it as long as your caregiver instructs. Only  remove it to shower or bathe. Move your arm as little as possible, but keep your hand moving to prevent swelling.  Squeeze a soft ball or foam pad as much as possible to help prevent swelling.  Only take over-the-counter or prescription medicines for pain, discomfort, or fever as directed by your caregiver. SEEK MEDICAL CARE IF:   Your shoulder pain increases, or new pain develops in your arm, hand, or fingers.  Your hand or fingers become cold and numb.  Your pain is not relieved with medicines. SEEK IMMEDIATE MEDICAL CARE IF:   Your arm, hand, or fingers are numb or tingling.  Your arm, hand, or fingers are significantly swollen or turn white or blue. MAKE SURE YOU:   Understand these instructions.  Will watch your condition.  Will get help right away if you are not doing well or get worse.   This information is not intended to replace advice given to you by your health care provider. Make sure you discuss any questions you have with your health care provider.   Document Released: 05/21/2005 Document Revised: 09/01/2014 Document Reviewed: 12/04/2014 Elsevier Interactive Patient Education [employer]     IF you received an x-ray today, you will receive an invoice from [facility]. Please contact [facility] at [phone] with questions or concerns regarding your invoice.   IF you received labwork today, you will receive an invoice from [employer]. Please contact [facility] at [phone] with questions or concerns  regarding your invoice.   Our billing staff will not be able to assist you with questions regarding bills from these companies.  You will be contacted with the lab results as soon as they are available. The fastest way to get your results is to activate your My Chart account. Instructions are located on the last page of this paperwork. If you have not heard from us regarding the results in 2 weeks, please contact  this office.

## 2016-06-02 ENCOUNTER — Ambulatory Visit (INDEPENDENT_AMBULATORY_CARE_PROVIDER_SITE_OTHER): Payer: Self-pay | Admitting: Physician Assistant

## 2016-06-02 VITALS — BP 128/90 | HR 69 | Temp 97.8°F | Resp 16 | Ht 66.5 in | Wt 179.8 lb

## 2016-06-02 DIAGNOSIS — R5382 Chronic fatigue, unspecified: Secondary | ICD-10-CM

## 2016-06-02 NOTE — Patient Instructions (Addendum)
I will let you know the results of your blood tests when they come back.  Please work on getting regular exercise and drinking plenty of water, at least 2 Liters/day. Try to get four days a week of at least 30 minutes of exercise.   Decrease the amount of sugars and carb you eat and replace them with vegetables and protein. Please see the below information for more ideas.  If your symptoms do not improve in 30 days with these lifestyle changes, we can do further testing.   IF you received an x-ray today, you will receive an invoice from [facility]. Please contact [facility] Radiology at [phone] with questions or concerns regarding your invoice.   IF you received labwork today, you will receive an invoice from [employer]. Please contact [facility] at [phone] with questions or concerns regarding your invoice.   Our billing staff will not be able to assist you with questions regarding bills from these companies.  You will be contacted with the lab results as soon as they are available. The fastest way to get your results is to activate your My Chart account. Instructions are located on the last page of this paperwork. If you have not heard from [place] regarding the results in 2 weeks, please contact this office.     Fatiga (Fatigue) La fatiga es una sensacin de cansancio en todo momento, falta de energa o falta de motivacin. La fatiga ocasional o leve con frecuencia es una reaccin normal a la actividad o la vida en general. Sin embargo, la fatiga de Set designer duracin (crnica) o extrema puede indicar una enfermedad preexistente. INSTRUCCIONES PARA EL CUIDADO EN EL HOGAR  Controle su fatiga para ver si hay cambios. Las siguientes indicaciones ayudarn a Psychologist, educational [facility] [employer] pueda sentir:  Hable con el mdico acerca de cunto debe dormir cada noche. Trate de dormir la cantidad de tiempo requerida todas las noches.  Tome los medicamentos solamente  como se lo haya indicado el mdico.  Siga una dieta saludable y nutritiva. Pida ayuda al mdico si necesita hacer cambios en su dieta.  Beba suficiente lquido para Photographer orina clara o de color amarillo plido.  Practique actividades que lo relajen, como yoga, meditacin, terapia de [place] o acupuntura.  Haga ejercicios regularmente.  Cambie las situaciones que le provocan estrs. Trate de que su [place] y personal sea moderada.  No consuma drogas.  Limite el consumo de alcohol a no ms de 1 medida por da si es mujer y no est Orthoptist, y 2 medidas si es hombre. Una medida equivale a 12onzas de cerveza, 5onzas de vino o 1onzas de bebidas alcohlicas de alta graduacin.  Tome una multivitamina, si se lo indic el mdico. SOLICITE ATENCIN MDICA SI:   La fatiga no mejora.  Tiene fiebre.  Tiene prdida o aumento involuntario de [place].  Tiene dolores de [place].  Tiene dificultad para:  Dormirse.  Dormir durante toda la noche.  Se siente enojado, culpable, ansioso o triste.  No puede defecar (estreimiento).  Tiene la piel seca.  Tiene hinchadas las piernas u otra parte del cuerpo. SOLICITE ATENCIN MDICA DE INMEDIATO SI:   Se siente confundido.  Tiene visin borrosa.  Sufre mareos o se desmaya.  Sufre un dolor intenso de [place].  Siente dolor intenso en el abdomen, la pelvis o la espalda.  Tiene dolor de pecho, dificultad para respirar, o latidos cardacos irregulares o acelerados.  No puede orinar u orina menos de lo  normal.  Presenta sangrado anormal, como sangrado del recto, la vagina, la nariz, los pulmones o los pezones.  Vomita sangre.  Tiene pensamientos acerca de hacerse dao a s mismo o cometer suicidio.  Le preocupa la posibilidad de hacerle dao a otra persona.   Esta informacin no tiene Theme park managercomo fin reemplazar el consejo del mdico. Asegrese de hacerle al mdico cualquier pregunta que tenga.   Document Released:  11/27/2008 Document Revised: 09/01/2014 Elsevier Interactive Patient Education [employer]2016 Elsevier Inc.      Why follow it? Research shows. . Those who follow the Mediterranean diet have a reduced risk of heart disease  . The diet is associated with a reduced incidence of Parkinson's and Alzheimer's diseases . People following the diet may have longer life expectancies and lower rates of chronic diseases  . The Dietary Guidelines for Americans recommends the Mediterranean diet as an eating plan to promote health and prevent disease  What Is the Mediterranean Diet?  . Healthy eating plan based on typical foods and recipes of Mediterranean-style cooking . The diet is primarily a plant based diet; these foods should make up a majority of meals   Starches - Plant based foods should make up a majority of meals - They are an important sources of vitamins, minerals, energy, antioxidants, and fiber - Choose whole grains, foods high in fiber and minimally processed items  - Typical grain sources include wheat, oats, barley, corn, brown rice, bulgar, farro, millet, polenta, couscous  - Various types of beans include chickpeas, lentils, fava beans, black beans, white beans   Fruits  Veggies - Large quantities of antioxidant rich fruits & veggies; 6 or more servings  - Vegetables can be eaten raw or lightly drizzled with oil and cooked  - Vegetables common to the traditional Mediterranean Diet include: artichokes, arugula, beets, broccoli, brussel sprouts, cabbage, carrots, celery, collard greens, cucumbers, eggplant, kale, leeks, lemons, lettuce, mushrooms, okra, onions, peas, peppers, potatoes, pumpkin, radishes, rutabaga, shallots, spinach, sweet potatoes, turnips, zucchini - Fruits common to the Mediterranean Diet include: apples, apricots, avocados, cherries, clementines, dates, figs, grapefruits, grapes, melons, nectarines, oranges, peaches, pears, pomegranates, strawberries, tangerines  Fats - Replace  butter and margarine with healthy oils, such as olive oil, canola oil, and tahini  - Limit nuts to no more than a handful a day  - Nuts include walnuts, almonds, pecans, pistachios, pine nuts  - Limit or avoid candied, honey roasted or heavily salted nuts - Olives are central to the PraxairMediterranean diet - can be eaten whole or used in a variety of dishes   Meats Protein - Limiting red meat: no more than a few times a month - When eating red meat: choose lean cuts and keep the portion to the size of deck of cards - Eggs: approx. 0 to 4 times a week  - Fish and lean poultry: at least 2 a week  - Healthy protein sources include, chicken, [place], lean beef, lamb - Increase intake of seafood such as tuna, salmon, trout, mackerel, shrimp, scallops - Avoid or limit high fat processed meats such as sausage and bacon  Dairy - Include moderate amounts of low fat dairy products  - Focus on healthy dairy such as fat free yogurt, skim milk, low or reduced fat cheese - Limit dairy products higher in fat such as whole or 2% milk, cheese, ice cream  Alcohol - Moderate amounts of red wine is ok  - No more than 5 oz daily for women (all ages) and  men older than age 43  - No more than 10 oz of wine daily for men younger than 48  Other - Limit sweets and other desserts  - Use herbs and spices instead of salt to flavor foods  - Herbs and spices common to the traditional Mediterranean Diet include: basil, bay leaves, chives, cloves, cumin, fennel, garlic, lavender, marjoram, mint, oregano, parsley, pepper, rosemary, sage, savory, sumac, tarragon, thyme   It's not just a diet, it's a lifestyle:  . The Mediterranean diet includes lifestyle factors typical of those in the region  . Foods, drinks and meals are best eaten with others and savored . Daily physical activity is important for overall good health . This could be strenuous exercise like running and aerobics . This could also be more leisurely activities such  as walking, housework, yard-work, or taking the stairs . Moderation is the key; a balanced and healthy diet accommodates most foods and drinks . Consider portion sizes and frequency of consumption of certain foods   Meal Ideas & Options:  . Breakfast:  o Whole wheat toast or whole wheat English muffins with peanut butter & hard boiled egg o Steel cut oats topped with apples & cinnamon and skim milk  o Fresh fruit: banana, strawberries, melon, berries, peaches  o Smoothies: strawberries, bananas, greek yogurt, peanut butter o Low fat greek yogurt with blueberries and granola  o Egg white omelet with spinach and mushrooms o Breakfast couscous: whole wheat couscous, apricots, skim milk, cranberries  . Sandwiches:  o Hummus and grilled vegetables (peppers, zucchini, squash) on whole wheat bread   o Grilled chicken on whole wheat pita with lettuce, tomatoes, cucumbers or tzatziki  o Tuna salad on whole wheat bread: tuna salad made with greek yogurt, olives, red peppers, capers, green onions o Garlic rosemary lamb pita: lamb sauted with garlic, rosemary, salt & pepper; add lettuce, cucumber, greek yogurt to pita - flavor with lemon juice and black pepper  . Seafood:  o Mediterranean grilled salmon, seasoned with garlic, basil, parsley, lemon juice and black pepper o Shrimp, lemon, and spinach whole-grain pasta salad made with low fat greek yogurt  o Seared scallops with lemon orzo  o Seared tuna steaks seasoned salt, pepper, coriander topped with tomato mixture of olives, tomatoes, olive oil, minced garlic, parsley, green onions and cappers  . Meats:  o Herbed greek chicken salad with kalamata olives, cucumber, feta  o Red bell peppers stuffed with spinach, bulgur, lean ground beef (or lentils) & topped with feta   o Kebabs: skewers of chicken, tomatoes, onions, zucchini, squash  o [place] burgers: made with red onions, mint, dill, lemon juice, feta cheese topped with roasted red  peppers . Vegetarian o Cucumber salad: cucumbers, artichoke hearts, celery, red onion, feta cheese, tossed in olive oil & lemon juice  o Hummus and whole grain pita points with a greek salad (lettuce, tomato, feta, olives, cucumbers, red onion) o Lentil soup with celery, carrots made with vegetable broth, garlic, salt and pepper  o Tabouli salad: parsley, bulgur, mint, scallions, cucumbers, tomato, radishes, lemon juice, olive oil, salt and pepper.

## 2016-06-02 NOTE — Progress Notes (Signed)
   Brigid ReDavid Hamada  MRN: [id number] DOB: 07/21/1981  PCP: No PCP Per Patient  Subjective:  Pt is a 35 year old male who presents to clinic for fatigue x a few years.  Feels tired all the time, even when he gets a good nights sleep. Doesn't have much energy. Gets better with exercise, he has more energy. Stopped after a thigh injury, he states starting back up exercising is difficult.  Notes decrease in sex drive x one year. Less desire, can get an erection, but just feels tired.  Has tried changing his diet, eats more veggies and fish.   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 Shifts changes a lot. Wakes up at 8 am some weeks, other weeks he wakes up at 11am. Stays there until 11 pm.   Denies chest pain, headache, home stressors, weight changes, decrease in interest.   Review of Systems  Constitutional: Positive for fatigue. Negative for activity change, appetite change and unexpected weight change.  Respiratory: Negative for cough, shortness of breath and wheezing.   Cardiovascular: Negative for chest pain and palpitations.  Gastrointestinal: Negative for abdominal pain, diarrhea, nausea and vomiting.  Musculoskeletal: Negative for arthralgias and myalgias.  Neurological: Positive for light-headedness. Negative for dizziness, syncope, weakness and headaches.  Psychiatric/Behavioral: Negative for decreased concentration and sleep disturbance. The patient is not nervous/anxious.     Patient Active Problem List   Diagnosis Date Noted  . Persistent pain in testicle 04/08/2016  . Groin pain 04/08/2016    Current Outpatient Prescriptions on File Prior to Visit  Medication Sig Dispense Refill  . Multiple Vitamin (MULTIVITAMIN) tablet Take 1 tablet by mouth daily.    . NON FORMULARY     . cyclobenzaprine (FLEXERIL) 5 MG tablet Take 1 tablet (5 mg total) by mouth 3 (three) times daily as needed. (Patient not taking: Reported on 06/02/2016) 60 tablet 1  . naproxen sodium (ANAPROX DS) 550  MG tablet Take 1 tablet (550 mg total) by mouth 2 (two) times daily with a meal. (Patient not taking: Reported on 06/02/2016) 30 tablet 1   No current facility-administered medications on file prior to visit.     No Known Allergies  Objective:  BP 128/90 (BP Location: Right Arm, Patient Position: Sitting, Cuff Size: Large)   Pulse 69   Temp 97.8 F (36.6 C) (Oral)   Resp 16   Ht 5' 6.5" (1.689 m)   Wt 179 lb 12.8 oz (81.6 kg)   SpO2 96%   BMI 28.59 kg/m   Physical Exam  Constitutional: He is oriented to person, place, and time and well-developed, well-nourished, and in no distress. No distress.  Cardiovascular: Normal rate, regular rhythm and normal heart sounds.   Pulmonary/Chest: Effort normal and breath sounds normal. No respiratory distress.  Neurological: He is alert and oriented to person, place, and time. GCS score is 15.  Skin: Skin is warm and dry.  Psychiatric: Mood, memory, affect and judgment normal.  Vitals reviewed.   Assessment and Plan :  1. Chronic fatigue - CBC - TSH - Basic metabolic panel - Labs pending. Encouraged patient to start regular exercise routine and eating a healthy diet, as this has helped with his energy level and libido in the past. Will contact him with lab results. He understands and agrees. RTC in 4-8 weeks if he still experiences fatigue with the discussed lifestyle modifications.   Marco CollieWhitney Jasie Meleski, PA-C  [facility] 06/02/2016 4:57 PM

## 2016-06-03 LAB — CBC
HCT: 45.3 % (ref 38.5–50.0)
Hemoglobin: 15.9 g/dL (ref 13.2–17.1)
MCH: 31.1 pg (ref 27.0–33.0)
MCHC: 35.1 g/dL (ref 32.0–36.0)
MCV: 88.6 fL (ref 80.0–100.0)
MPV: 9.1 fL (ref 7.5–12.5)
Platelets: 229 10*3/uL (ref 140–400)
RBC: 5.11 MIL/uL (ref 4.20–5.80)
RDW: 13.1 % (ref 11.0–15.0)
WBC: 7 10*3/uL (ref 3.8–10.8)

## 2016-06-03 LAB — TSH: TSH: 1.82 mIU/L (ref 0.40–4.50)

## 2016-06-03 LAB — BASIC METABOLIC PANEL
CO2: 26 mmol/L (ref 20–31)
Calcium: 10.1 mg/dL (ref 8.6–10.3)
Chloride: 101 mmol/L (ref 98–110)
Potassium: 4.2 mmol/L (ref 3.5–5.3)
Sodium: 138 mmol/L (ref 135–146)

## 2016-06-03 LAB — BASIC METABOLIC PANEL WITH GFR
BUN: 15 mg/dL (ref 7–25)
Creat: 1.05 mg/dL (ref 0.60–1.35)
Glucose, Bld: 104 mg/dL — ABNORMAL HIGH (ref 65–99)

## 2016-06-07 ENCOUNTER — Encounter: Payer: Self-pay | Admitting: Physician Assistant

## 2016-06-07 NOTE — Progress Notes (Signed)
Lab work reviewed: There is no sign of anemia. Thyroid function looks normal. There is a slight elevation to your blood sugar level, however this can easily be corrected with a healthy diet and regular exercise. As discussed at your last visit, please feel free to return to the clinic if you are still experiencing daily fatigue after you have initiated a routine exercise program and healthy diet.

## 2016-06-10 ENCOUNTER — Telehealth: Payer: Self-pay

## 2016-06-10 NOTE — Telephone Encounter (Signed)
Patient would like a phone call once the labs have been reviewed. Please call! [phone]

## 2016-06-13 ENCOUNTER — Telehealth: Payer: Self-pay | Admitting: Emergency Medicine

## 2016-06-13 ENCOUNTER — Encounter: Payer: Self-pay | Admitting: Physician Assistant

## 2016-06-13 NOTE — Telephone Encounter (Signed)
Please post lab results 

## 2016-06-13 NOTE — Telephone Encounter (Signed)
Patient given normal lab results

## 2016-06-13 NOTE — Telephone Encounter (Signed)
Ok to release lab results. Please call patient and let him know they are normal. I sent him a letter, will send it again. Encouraged patient to start regular exercise routine and eating a healthy diet, as this has helped with his energy level and libido in the past.

## 2016-06-19 ENCOUNTER — Telehealth: Payer: Self-pay | Admitting: Emergency Medicine

## 2016-10-16 ENCOUNTER — Ambulatory Visit (INDEPENDENT_AMBULATORY_CARE_PROVIDER_SITE_OTHER): Payer: Self-pay | Admitting: Family Medicine

## 2016-10-16 VITALS — BP 116/74 | HR 81 | Temp 98.1°F | Resp 14 | Ht 66.5 in | Wt 181.2 lb

## 2016-10-16 DIAGNOSIS — J029 Acute pharyngitis, unspecified: Secondary | ICD-10-CM

## 2016-10-16 DIAGNOSIS — J069 Acute upper respiratory infection, unspecified: Secondary | ICD-10-CM

## 2016-10-16 DIAGNOSIS — R509 Fever, unspecified: Secondary | ICD-10-CM

## 2016-10-16 DIAGNOSIS — B9789 Other viral agents as the cause of diseases classified elsewhere: Secondary | ICD-10-CM

## 2016-10-16 DIAGNOSIS — K439 Ventral hernia without obstruction or gangrene: Secondary | ICD-10-CM

## 2016-10-16 LAB — POCT INFLUENZA A/B
INFLUENZA A, POC: NEGATIVE
Influenza B, POC: NEGATIVE

## 2016-10-16 LAB — POCT RAPID STREP A (OFFICE): Rapid Strep A Screen: NEGATIVE

## 2016-10-16 NOTE — Progress Notes (Addendum)
Joe Booth is a 36 y.o. male who presents to [facility] today for sore throat and fever:  1.  Sore throat and fever:  Also with some diarrhea.  Patient's symptoms started 2 days ago. Started initially with decreased appetite. Progressed to subjective fevers and headaches. Some loose stool yesterday but this has since resolved. No abdominal pain. His nasal congestion. No cough. He has generalized malaise. Does not have any myalgias or aches. He has not had much appetite and has not had anything to drink today. He has no nausea or vomiting.   He feels hungry right now and wants to go get something to eat.  He is starting to feel a little better today, and just wanted to get checked out and make sure he will continue to get better.  2.  Hernia:  Present for years. He works out with [employer] will like to have this resolved as he is scared he will make it worse by working out. Is located just above his umbilicus. States this never hurts but he does not like the fact that it protrudes. No constipation. Did have a few episodes of loose bowels yesterday as noted above but none today. No melena or hematochezia. No nausea vomiting.  ROS as above.     PMH reviewed. Patient is a nonsmoker.   Past Medical History:  Diagnosis Date  . ACL tear 01/2014   right  . Medial meniscus tear 01/2014   right knee   Past Surgical History:  Procedure Laterality Date  . KNEE ARTHROSCOPY W/ ACL RECONSTRUCTION Right 06/12/2010  . KNEE ARTHROSCOPY WITH ANTERIOR CRUCIATE LIGAMENT (ACL) REPAIR Right 02/08/2014   Procedure: KNEE ARTHROSCOPY WITH ANTERIOR CRUCIATE LIGAMENT (ACL) REPAIR and partial medial menisectomy;  Surgeon: Harvie JuniorJohn L Graves, MD;  Location: [facility];  Service: Orthopedics;  Laterality: Right;    Medications reviewed. Current Outpatient Prescriptions  Medication Sig Dispense Refill  . Multiple Vitamin (MULTIVITAMIN) tablet Take 1 tablet by mouth daily.     No current  facility-administered medications for this visit.      Physical Exam:  BP 116/74   Pulse 81   Temp 98.1 F (36.7 C) (Oral)   Resp 14   Ht 5' 6.5" (1.689 m)   Wt 181 lb 3.2 oz (82.2 kg)   SpO2 96%   BMI 28.81 kg/m  Gen:  Patient sitting on exam table, appears stated age in no acute distress.  Not ill appearing Head: Normocephalic atraumatic Eyes: EOMI, PERRL, sclera and conjunctiva non-erythematous Ears:  Canals clear bilaterally.  TMs pearly gray bilaterally without erythema or bulging.   Nose:  Nasal turbinates erythematous with clear nasal drainage.   Mouth: Mucosa membranes moist. Tonsils +2, nonenlarged, non-erythematous. Neck: No cervical lymphadenopathy noted Heart:  RRR, no murmurs auscultated. Pulm:  Clear to auscultation bilaterally with good air movement.  No wheezes or rales noted.   Abd:  Small hernia noted about 2 cm above umbilius.  Non painful.  Easily reduced.  Results for orders placed or performed in visit on 10/16/16  POCT Influenza A/B  Result Value Ref Range   Influenza A, POC Negative Negative   Influenza B, POC Negative Negative  POCT rapid strep A  Result Value Ref Range   Rapid Strep A Screen Negative Negative    Assessment and Plan:  1.  Viral URI: - negative flu swab and negative strep swab.  - Improving today.   - Should continue to improve.  See instructions  for symptomatic relief.   2.  Hernia: - Desires definitive repair. -Like to be referred to surgery for discussion about surgery. -We'll refer today

## 2016-10-16 NOTE — Patient Instructions (Addendum)
Both your flu and strep test were negative.    You have a viral upper respiratory infection. This should start to get better about 7 - 10 days after it started.    Make sure that you're drinking plenty of water..   Drinking warm liquids such as teas and soups can help with secretions and cough. A mist humidifier or vaporizer can work well to help with secretions and cough.  It is very important to clean the humidifier between use according to the instructions.    It was good to see you.  If you're still having trouble in the next week, come back and see us.    Of course, if you start having trouble breathing, worsening fevers, vomiting and unable to hold down any fluids, or you have other concerns, don't hesitate to come back or go to the ED after hours.       IF you received an x-ray today, you will receive an invoice from [facility]. Please contact [facility] at [phone] with questions or concerns regarding your invoice.   IF you received labwork today, you will receive an invoice from [facility]. Please contact [facility] at [phone] with questions or concerns regarding your invoice.   Our billing staff will not be able to assist you with questions regarding bills from these companies.  You will be contacted with the lab results as soon as they are available. The fastest way to get your results is to activate your My Chart account. Instructions are located on the last page of this paperwork. If you have not heard from us regarding the results in 2 weeks, please contact this office.

## 2016-10-16 NOTE — Addendum Note (Signed)
Addended by: Tobey GrimWALDEN, Oaklyn Mans H on: 10/16/2016 02:59 PM   Modules accepted: Orders

## 2016-11-16 IMAGING — DX DG SHOULDER 2+V*R*
3 series · 3 of 3 positions shown · non-contrast
Comparison: None.

CLINICAL DATA: Right shoulder pain.  Injury.

EXAM:
RIGHT SHOULDER - 2+ VIEW

[shoulder ap]
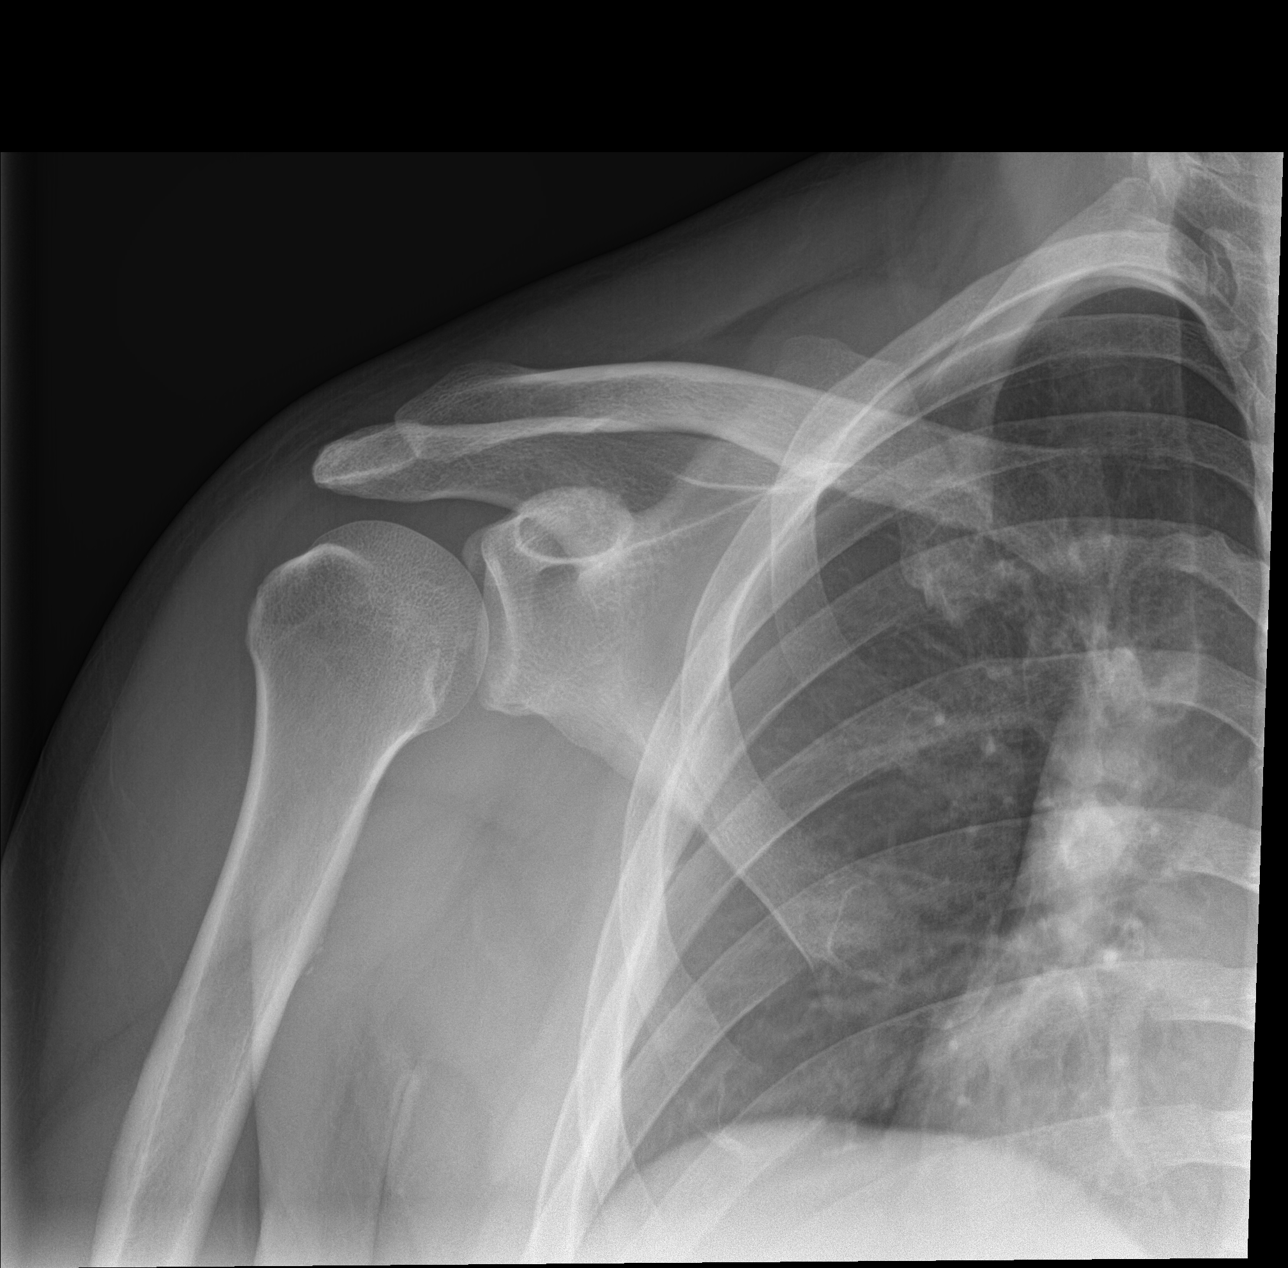

[shoulder y-view]
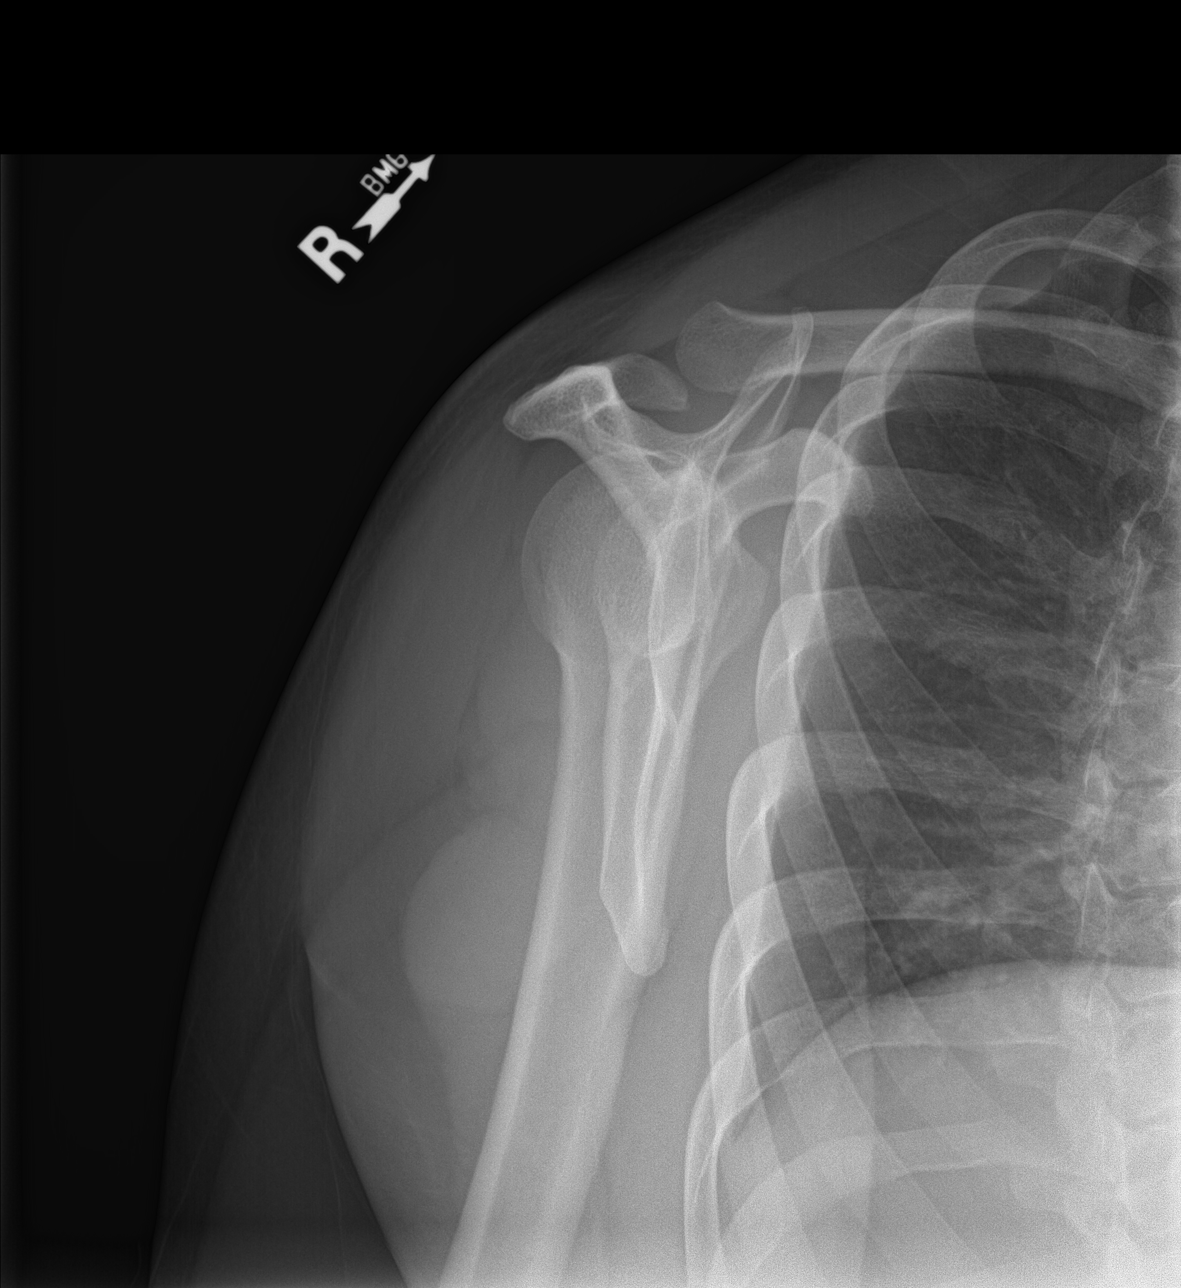

[shoulder axial]
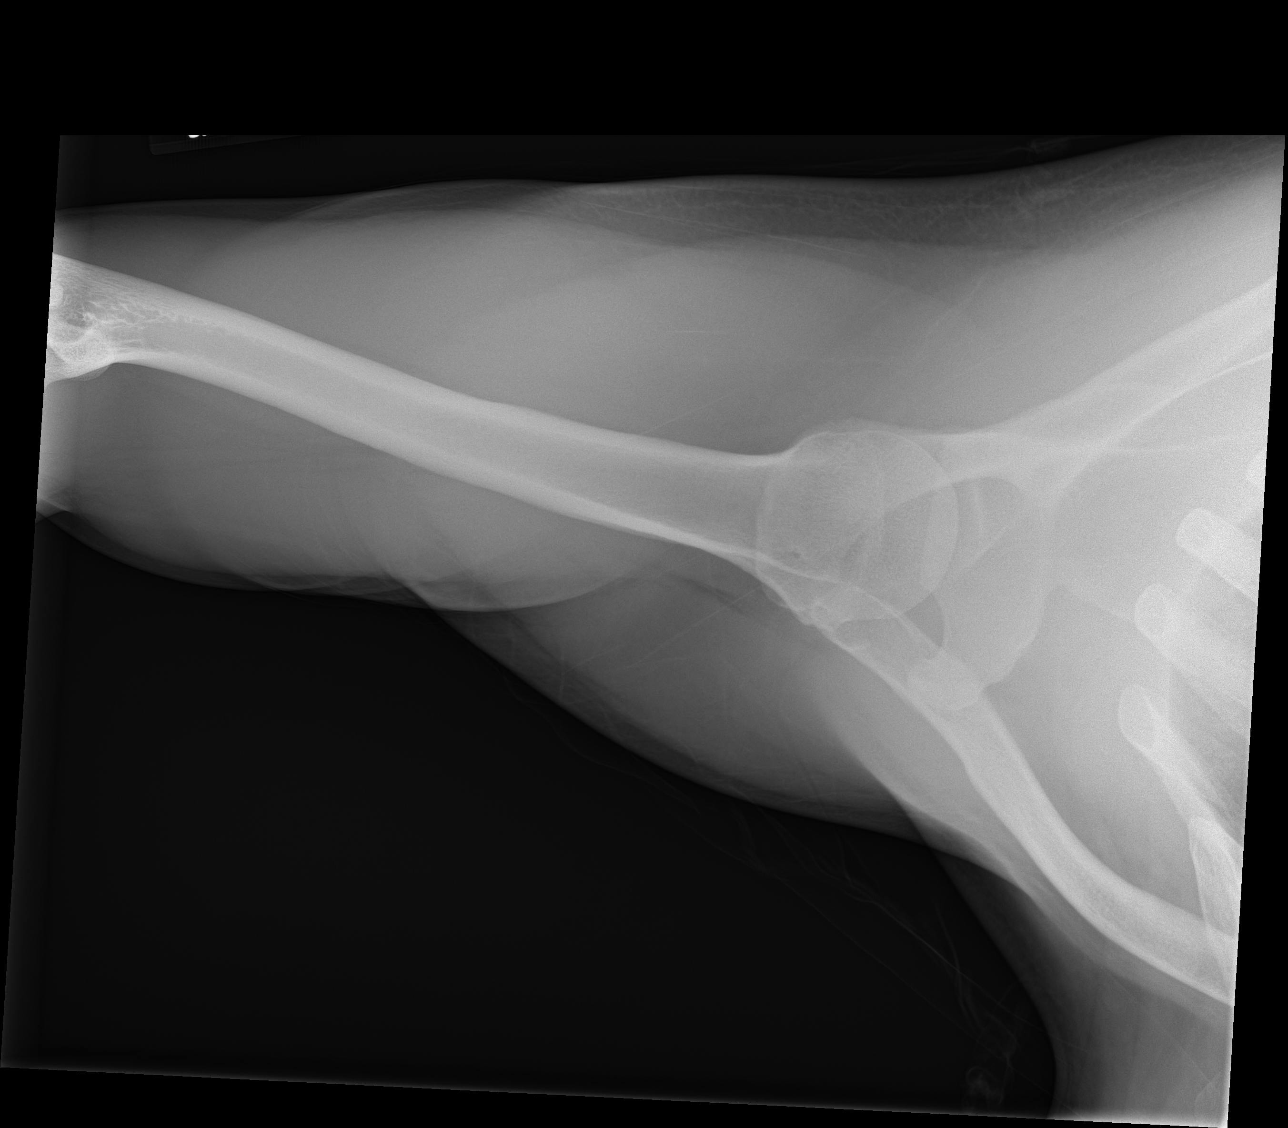

[3 of 3 positions shown; findings below may reference images not displayed]

FINDINGS: There is no evidence of fracture or dislocation. There is no
evidence of arthropathy or other focal bone abnormality. Soft
tissues are unremarkable.
IMPRESSION: Negative.

## 2017-06-03 ENCOUNTER — Encounter: Payer: Self-pay | Admitting: Emergency Medicine

## 2017-06-03 ENCOUNTER — Ambulatory Visit
Admission: EM | Admit: 2017-06-03 | Discharge: 2017-06-03 | Disposition: A | Discharge status: Home / Self Care | Payer: No Typology Code available for payment source | Attending: Family Medicine | Admitting: Family Medicine

## 2017-06-03 DIAGNOSIS — M549 Dorsalgia, unspecified: Secondary | ICD-10-CM

## 2017-06-03 DIAGNOSIS — R0789 Other chest pain: Secondary | ICD-10-CM | POA: Insufficient documentation

## 2017-06-03 LAB — TROPONIN I: Troponin I: <0.03 ng/mL (ref <0.03)

## 2017-06-03 NOTE — ED Triage Notes (Signed)
Patient c/o left sided chest pain that has moved to the left upper back for the past 5 days.  Patient denies SOB.

## 2017-06-03 NOTE — ED Provider Notes (Signed)
[facility]    CSN: [id number] Arrival date & time: 06/03/17  1223  History   Chief Complaint Chief Complaint  Patient presents with  . Chest Pain  . Back Pain   HPI   36 year old male presents with the above complaints.  Patient reports that for 5 days ago he developed left-sided chest pressure. He recalls going to the gym prior to that and doing chest exercises. He is unsure if this is the culprit of his pain. Pain was moderate to severe and interferes with sleep. Subsequently moved/radiated to the side and back. No reports of shortness of breath or dyspnea on exertion. No diaphoresis. He was not exerting himself when the pain occurred. He currently has minimal pain. He states that yesterday his pain was gone completely. He states that his pain currently is located in the left upper back. He has no cardiac factors. Family history of diabetes but no cardiac history per his report.  Past Medical History:  Diagnosis Date  . ACL tear 01/2014   right  . Medial meniscus tear 01/2014   right knee   Patient Active Problem List   Diagnosis Date Noted  . Persistent pain in testicle 04/08/2016  . Groin pain 04/08/2016   Past Surgical History:  Procedure Laterality Date  . KNEE ARTHROSCOPY W/ ACL RECONSTRUCTION Right 06/12/2010  . KNEE ARTHROSCOPY WITH ANTERIOR CRUCIATE LIGAMENT (ACL) REPAIR Right 02/08/2014   Procedure: KNEE ARTHROSCOPY WITH ANTERIOR CRUCIATE LIGAMENT (ACL) REPAIR and partial medial menisectomy;  Surgeon: Harvie Junior, MD;  Location:  S[facility];  Service: Orthopedics;  Laterality: Right;    Home Medications    Prior to Admission medications   Medication Sig Start Date End Date Taking? Authorizing Provider  Multiple Vitamin (MULTIVITAMIN) tablet Take 1 tablet by mouth daily.    [provider]    Family History Family History  Problem Relation Age of Onset  . Diabetes Neg Hx     Social History Social History  Substance Use  Topics  . Smoking status: Never Smoker  . Smokeless tobacco: Never Used  . Alcohol use Yes     Comment: occasionally   Allergies   Patient has no known allergies.   Review of Systems Review of Systems  Cardiovascular: Positive for chest pain.  Musculoskeletal: Positive for back pain.  All other systems reviewed and are negative.  Physical Exam Triage Vital Signs ED Triage Vitals  Enc Vitals Group     BP 06/03/17 1235 125/90     Pulse Rate 06/03/17 1235 67     Resp 06/03/17 1235 16     Temp 06/03/17 1235 97.8 F (36.6 C)     Temp Source 06/03/17 1235 Oral     SpO2 06/03/17 1235 100 %     Weight 06/03/17 1233 180 lb (81.6 kg)     Height 06/03/17 1233  (1.651 m)     Head Circumference --      Peak Flow --      Pain Score 06/03/17 1235 7     Pain Loc --      Pain Edu? --      Excl. in GC? --    Updated Vital Signs BP 125/90 (BP Location: Left Arm)   Pulse 67   Temp 97.8 F (36.6 C) (Oral)   Resp 16   Ht  (1.651 m)   Wt 180 lb (81.6 kg)   SpO2 100%   BMI 29.95 kg/m   Physical Exam  Constitutional: He is oriented to person, place, and time. He appears well-developed and well-nourished. No distress.  HENT:  Head: Normocephalic and atraumatic.  Nose: Nose normal.  Mouth/Throat: Oropharynx is clear and moist. No oropharyngeal exudate.  Normal TM's bilaterally.   Eyes: Conjunctivae are normal. No scleral icterus.  Neck: Neck supple. No thyromegaly present.  Cardiovascular: Normal rate and regular rhythm.   No murmur heard. Pulmonary/Chest: Effort normal and breath sounds normal. He has no wheezes. He has no rales.  Abdominal: Soft. He exhibits no distension. There is no tenderness. There is no rebound and no guarding.  Musculoskeletal: Normal range of motion. He exhibits no edema.  Lymphadenopathy:    He has no cervical adenopathy.  Neurological: He is alert and oriented to person, place, and time.  Skin: Skin is warm and dry. No rash noted.    Psychiatric: He has a normal mood and affect.  Vitals reviewed.  UC Treatments / Results  Labs (all labs ordered are listed, but only abnormal results are displayed) Labs Reviewed  TROPONIN I   ED ECG REPORT   Date: 06/03/2017  EKG Time: 1:40 PM  Rate: 55  Rhythm: sinus bradycardia  Axis: Normal.  Intervals: Normal intervals.  ST&T Change: No.  Narrative Interpretation: Sinus bradycardia at the rate of 55. No ST or T-wave changes. No evidence of ischemia. Normal EKG.  Radiology No results found.  Procedures Procedures (including critical care time)  Medications Ordered in UC Medications - No data to display   Initial Impression / Assessment and Plan / UC Course  I have reviewed the triage vital signs and the nursing notes.  Pertinent labs & imaging results that were available during my care of the patient were reviewed by me and considered in my medical decision making (see chart for details).   36 year old male presents with chest pain/back pain. Seems to be musculoskeletal in origin. He has no chronic risk factors or family history of chronic disease. EKG was normal. Troponin negative. PRN Tylenol/motrin.  Final Clinical Impressions(s) / UC Diagnoses   Final diagnoses:  Atypical chest pain   New Prescriptions New Prescriptions   No medications on file    Controlled Substance Prescriptions [place] Controlled Substance Registry consulted? Not Applicable   Tommie Sams, DO 06/03/17 1340

## 2017-06-03 NOTE — Discharge Instructions (Signed)
Your exam, EKG and cardiac enzymes were normal.  This is likely from your recent workout. Tylenol/motrin as needed.  Take care  Dr. Adriana Simas

## 2019-01-11 ENCOUNTER — Encounter: Payer: Self-pay | Admitting: Registered Nurse

## 2019-11-04 ENCOUNTER — Ambulatory Visit: Payer: Self-pay | Attending: Internal Medicine

## 2019-11-04 DIAGNOSIS — Z23 Encounter for immunization: Secondary | ICD-10-CM

## 2019-11-04 NOTE — Progress Notes (Signed)
   Covid-19 Vaccination Clinic  Name:  Joe Booth    MRN: [id number] DOB: [birth year]  11/04/2019  [name] was observed post Covid-19 immunization for 15 minutes without incident. He was provided with Vaccine Information Sheet and instruction to access the V-Safe system.   [name] was instructed to call 911 with any severe reactions post vaccine: Marland Kitchen Difficulty breathing  . Swelling of face and throat  . A fast heartbeat  . A bad rash all over body  . Dizziness and weakness   Immunizations Administered    Name Date Dose VIS Date Route   Pfizer COVID-19 Vaccine 11/04/2019  3:08 PM 0.3 mL 08/05/2019 Intramuscular   Manufacturer: [employer], [employer]   Lot: [id number]   NDC: [address]-3

## 2019-11-28 ENCOUNTER — Ambulatory Visit: Payer: Self-pay | Attending: Internal Medicine

## 2019-11-28 DIAGNOSIS — Z23 Encounter for immunization: Secondary | ICD-10-CM

## 2019-11-28 NOTE — Progress Notes (Signed)
   Covid-19 Vaccination Clinic  Name:  Joe Booth    MRN: [id number] DOB: 09/02/1980  11/28/2019  [name] was observed post Covid-19 immunization for 15 minutes without incident. He was provided with Vaccine Information Sheet and instruction to access the V-Safe system.   [name] was instructed to call 911 with any severe reactions post vaccine: Marland Kitchen Difficulty breathing  . Swelling of face and throat  . A fast heartbeat  . A bad rash all over body  . Dizziness and weakness   Immunizations Administered    Name Date Dose VIS Date Route   Pfizer COVID-19 Vaccine 11/28/2019  3:35 PM 0.3 mL 08/05/2019 Intramuscular   Manufacturer: [employer], [employer]   Lot: [id number]   NDC: [address]-3
